# Patient Record
Sex: Male | Born: 1985 | Race: Black or African American | Hispanic: No | Marital: Single | Smoking: Current some day smoker
Health system: Southern US, Community
[De-identification: ages and names within clinical notes are randomized; demographics above are authoritative.]

## PROBLEM LIST (undated history)

## (undated) DIAGNOSIS — J45909 Unspecified asthma, uncomplicated: Secondary | ICD-10-CM

---

## 2009-11-07 ENCOUNTER — Inpatient Hospital Stay (HOSPITAL_COMMUNITY): Admission: EM | Admit: 2009-11-07 | Discharge: 2009-11-13 | Payer: Self-pay

## 2010-04-14 ENCOUNTER — Ambulatory Visit
Admission: RE | Admit: 2010-04-14 | Discharge: 2010-04-14 | Payer: Self-pay | Source: Home / Self Care | Admitting: Orthopedic Surgery

## 2010-11-10 LAB — SEDIMENTATION RATE: Sed Rate: 9 mm/hr (ref 0–16)

## 2010-11-10 LAB — C-REACTIVE PROTEIN: CRP: 0.1 mg/dL — ABNORMAL LOW (ref <0.6)

## 2010-11-10 LAB — POCT HEMOGLOBIN-HEMACUE: Hemoglobin: 15.9 g/dL (ref 13.0–17.0)

## 2010-11-20 LAB — CBC
HCT: 35.5 % — ABNORMAL LOW (ref 39.0–52.0)
HCT: 38.7 % — ABNORMAL LOW (ref 39.0–52.0)
Hemoglobin: 12.3 g/dL — ABNORMAL LOW (ref 13.0–17.0)
Hemoglobin: 13.1 g/dL (ref 13.0–17.0)
MCHC: 33.8 g/dL (ref 30.0–36.0)
MCHC: 34.6 g/dL (ref 30.0–36.0)
MCHC: 35 g/dL (ref 30.0–36.0)
MCV: 94.5 fL (ref 78.0–100.0)
MCV: 94.8 fL (ref 78.0–100.0)
MCV: 95.1 fL (ref 78.0–100.0)
Platelets: 137 10*3/uL — ABNORMAL LOW (ref 150–400)
Platelets: 174 10*3/uL (ref 150–400)
RBC: 3.73 MIL/uL — ABNORMAL LOW (ref 4.22–5.81)
RDW: 14.4 % (ref 11.5–15.5)
RDW: 14.7 % (ref 11.5–15.5)
WBC: 10.6 10*3/uL — ABNORMAL HIGH (ref 4.0–10.5)
WBC: 16.4 10*3/uL — ABNORMAL HIGH (ref 4.0–10.5)

## 2010-11-20 LAB — BASIC METABOLIC PANEL
BUN: 16 mg/dL (ref 6–23)
BUN: 6 mg/dL (ref 6–23)
BUN: 7 mg/dL (ref 6–23)
CO2: 30 mEq/L (ref 19–32)
CO2: 32 mEq/L (ref 19–32)
Calcium: 8.5 mg/dL (ref 8.4–10.5)
Calcium: 8.7 mg/dL (ref 8.4–10.5)
Calcium: 9.2 mg/dL (ref 8.4–10.5)
Chloride: 101 mEq/L (ref 96–112)
Chloride: 102 mEq/L (ref 96–112)
Creatinine, Ser: 1.15 mg/dL (ref 0.4–1.5)
Creatinine, Ser: 1.63 mg/dL — ABNORMAL HIGH (ref 0.4–1.5)
GFR calc Af Amer: >60 mL/min (ref >60)
GFR calc Af Amer: >60 mL/min (ref >60)
GFR calc non Af Amer: >60 mL/min (ref >60)
Glucose, Bld: 94 mg/dL (ref 70–99)
Potassium: 3 mEq/L — ABNORMAL LOW (ref 3.5–5.1)
Potassium: 4 mEq/L (ref 3.5–5.1)

## 2010-11-20 LAB — ABO/RH: ABO/RH(D): A POS

## 2010-11-20 LAB — TYPE AND SCREEN
ABO/RH(D): A POS
Antibody Screen: NEGATIVE
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
## 2012-11-06 ENCOUNTER — Emergency Department (HOSPITAL_COMMUNITY)
Admission: EM | Admit: 2012-11-06 | Discharge: 2012-11-06 | Disposition: A | Discharge status: Home / Self Care | Payer: No Typology Code available for payment source | Attending: Emergency Medicine | Admitting: Emergency Medicine

## 2012-11-06 ENCOUNTER — Encounter (HOSPITAL_COMMUNITY): Payer: Self-pay | Admitting: *Deleted

## 2012-11-06 ENCOUNTER — Emergency Department (HOSPITAL_COMMUNITY): Payer: No Typology Code available for payment source

## 2012-11-06 DIAGNOSIS — S139XXA Sprain of joints and ligaments of unspecified parts of neck, initial encounter: Secondary | ICD-10-CM | POA: Insufficient documentation

## 2012-11-06 DIAGNOSIS — Y9389 Activity, other specified: Secondary | ICD-10-CM | POA: Insufficient documentation

## 2012-11-06 DIAGNOSIS — S8990XA Unspecified injury of unspecified lower leg, initial encounter: Secondary | ICD-10-CM | POA: Insufficient documentation

## 2012-11-06 DIAGNOSIS — IMO0002 Reserved for concepts with insufficient information to code with codable children: Secondary | ICD-10-CM | POA: Insufficient documentation

## 2012-11-06 DIAGNOSIS — Y92410 Unspecified street and highway as the place of occurrence of the external cause: Secondary | ICD-10-CM | POA: Insufficient documentation

## 2012-11-06 MED ORDER — HYDROCODONE-ACETAMINOPHEN 5-325 MG PO TABS: 1.0000 | ORAL_TABLET | Freq: Four times a day (QID) | ORAL | Status: DC | PRN

## 2012-11-06 MED ORDER — NAPROXEN 500 MG PO TABS: 500.0000 mg | ORAL_TABLET | Freq: Two times a day (BID) | ORAL | Status: DC

## 2012-11-06 MED ORDER — OXYCODONE-ACETAMINOPHEN 5-325 MG PO TABS
2.0000 | ORAL_TABLET | Freq: Once | ORAL | Status: AC
  Administered 2012-11-06: 2 via ORAL
  Filled 2012-11-06: qty 2

## 2012-11-06 MED ORDER — METHOCARBAMOL 500 MG PO TABS: 500.0000 mg | ORAL_TABLET | Freq: Two times a day (BID) | ORAL | Status: DC
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
## 2012-11-11 NOTE — ED Provider Notes (Signed)
History/physical exam/procedure(s) were performed by non-physician practitioner and as supervising physician I was immediately available for consultation/collaboration. I have reviewed all notes and am in agreement with care and plan.   Hilario Quarry, MD 11/11/12 1044

## 2014-11-30 ENCOUNTER — Emergency Department (HOSPITAL_COMMUNITY)
Admission: EM | Admit: 2014-11-30 | Discharge: 2014-11-30 | Disposition: A | Discharge status: Home / Self Care | Payer: Self-pay | Attending: Emergency Medicine | Admitting: Emergency Medicine

## 2014-11-30 ENCOUNTER — Emergency Department (HOSPITAL_COMMUNITY): Payer: Self-pay

## 2014-11-30 ENCOUNTER — Encounter (HOSPITAL_COMMUNITY): Payer: Self-pay

## 2014-11-30 DIAGNOSIS — Y9367 Activity, basketball: Secondary | ICD-10-CM | POA: Insufficient documentation

## 2014-11-30 DIAGNOSIS — Z791 Long term (current) use of non-steroidal anti-inflammatories (NSAID): Secondary | ICD-10-CM | POA: Insufficient documentation

## 2014-11-30 DIAGNOSIS — R52 Pain, unspecified: Secondary | ICD-10-CM

## 2014-11-30 DIAGNOSIS — M20022 Boutonniere deformity of left finger(s): Secondary | ICD-10-CM | POA: Insufficient documentation

## 2014-11-30 DIAGNOSIS — Z720 Tobacco use: Secondary | ICD-10-CM | POA: Insufficient documentation

## 2014-11-30 DIAGNOSIS — Y92310 Basketball court as the place of occurrence of the external cause: Secondary | ICD-10-CM | POA: Insufficient documentation

## 2014-11-30 DIAGNOSIS — S62617A Displaced fracture of proximal phalanx of left little finger, initial encounter for closed fracture: Secondary | ICD-10-CM | POA: Insufficient documentation

## 2014-11-30 DIAGNOSIS — J45909 Unspecified asthma, uncomplicated: Secondary | ICD-10-CM | POA: Insufficient documentation

## 2014-11-30 DIAGNOSIS — Y998 Other external cause status: Secondary | ICD-10-CM | POA: Insufficient documentation

## 2014-11-30 DIAGNOSIS — W2105XA Struck by basketball, initial encounter: Secondary | ICD-10-CM | POA: Insufficient documentation

## 2014-11-30 DIAGNOSIS — Z79899 Other long term (current) drug therapy: Secondary | ICD-10-CM | POA: Insufficient documentation

## 2014-11-30 HISTORY — DX: Unspecified asthma, uncomplicated: J45.909

## 2014-11-30 MED ORDER — HYDROCODONE-ACETAMINOPHEN 5-325 MG PO TABS: ORAL_TABLET | ORAL | Status: DC

## 2014-11-30 NOTE — ED Notes (Signed)
Pt stable, ambulatory, pain 8/10, states understanding of discharge instructions

## 2014-11-30 NOTE — ED Notes (Signed)
Pt reports he was playing basketball 2 weeks ago and thinks he broke or dislocated his left pinky finger. He pulled it and bent it and "felt it pop." pt reports pain is getting worse and so he decided to have it assessed today. Finger appears swollen and able to move finger without difficulty.
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
## 2014-11-30 NOTE — Discharge Instructions (Signed)
You have a broken bone and a deformity is her tendon is not on the right place. He need to see the hand surgeon for definitive correction of this. Do not hesitate to return to the ED for any new, worsening or concerning symptoms.

## 2014-12-07 ENCOUNTER — Emergency Department (HOSPITAL_COMMUNITY)
Admission: EM | Admit: 2014-12-07 | Discharge: 2014-12-07 | Disposition: A | Discharge status: Home / Self Care | Payer: Self-pay | Attending: Emergency Medicine | Admitting: Emergency Medicine

## 2014-12-07 ENCOUNTER — Encounter (HOSPITAL_COMMUNITY): Payer: Self-pay | Admitting: *Deleted

## 2014-12-07 DIAGNOSIS — J45909 Unspecified asthma, uncomplicated: Secondary | ICD-10-CM | POA: Insufficient documentation

## 2014-12-07 DIAGNOSIS — S6992XD Unspecified injury of left wrist, hand and finger(s), subsequent encounter: Secondary | ICD-10-CM | POA: Insufficient documentation

## 2014-12-07 DIAGNOSIS — X58XXXD Exposure to other specified factors, subsequent encounter: Secondary | ICD-10-CM | POA: Insufficient documentation

## 2014-12-07 DIAGNOSIS — Z720 Tobacco use: Secondary | ICD-10-CM | POA: Insufficient documentation

## 2014-12-07 MED ORDER — TRAMADOL HCL 50 MG PO TABS: 50.0000 mg | ORAL_TABLET | Freq: Four times a day (QID) | ORAL | Status: DC | PRN

## 2014-12-07 NOTE — ED Provider Notes (Signed)
CSN: [id number]     Arrival date & time 12/07/14  1629 History  This chart was scribed for non-physician practitioner, Sharilyn SitesLisa Sanders, PA-C working with Nelva Nayobert Beaton, MD by Greggory StallionKayla Andersen, ED scribe. This patient was seen in room TR03C/TR03C and the patient's care was started at 5:04 PM.   Chief Complaint  Patient presents with  . Finger Injury   The history is provided by the patient. No language interpreter was used.    HPI Comments: Gabriel Ross is a 29 y.o. male who presents to the Emergency Department complaining of left fifth finger injury that occurred about 3 weeks ago. He originally dislocated it but reports continued pain and swelling in the finger. Pt has been evaluated in the ED and was told to follow up with Dr. Amanda PeaGramig. 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 Carlos LeveringGramig's office told him to come to the ED to be evaluated today. Movements worsen pain. He has worn the finger splint as directed but thinks it made the finger worse. Pt denies numbness. He is right hand dominant.   Past Medical History  Diagnosis Date  . Asthma    History reviewed. No pertinent past surgical history. No family history on file. History  Substance Use Topics  . Smoking status: Current Some Day Smoker  . Smokeless tobacco: Not on file  . Alcohol Use: Yes     Comment: socially    Review of Systems  Musculoskeletal: Positive for joint swelling and arthralgias.  Neurological: Negative for numbness.  All other systems reviewed and are negative.  Allergies  Review of patient's allergies indicates no known allergies.  Home Medications   Prior to Admission medications   Medication Sig Start Date End Date Taking? Authorizing Provider  acetaminophen (TYLENOL) 500 MG tablet Take 500 mg by mouth every 6 (six) hours as needed for mild pain.    Historical Provider, MD  HYDROcodone-acetaminophen (NORCO/VICODIN) 5-325 MG per tablet Take 1-2 tablets by mouth every 6 hours as needed for pain  and/or cough. 11/30/14   Nicole Pisciotta, PA-C  methocarbamol (ROBAXIN) 500 MG tablet Take 1 tablet (500 mg total) by mouth 2 (two) times daily. Patient not taking: Reported on 11/30/2014 11/06/12   Roxy Horsemanobert Browning, PA-C  naproxen (NAPROSYN) 500 MG tablet Take 1 tablet (500 mg total) by mouth 2 (two) times daily with a meal. Patient not taking: Reported on 11/30/2014 11/06/12   Roxy Horsemanobert Browning, PA-C   BP 122/76 mmHg  Pulse 97  Temp(Src) 98.7 F (37.1 C) (Oral)  Resp 18  Ht 6' 2.5" (1.892 m)  Wt 176 lb (79.833 kg)  BMI 22.30 kg/m2  SpO2 96%   Physical Exam  Constitutional: He is oriented to person, place, and time. He appears well-developed and well-nourished.  HENT:  Head: Normocephalic and atraumatic.  Mouth/Throat: Oropharynx is clear and moist.  Eyes: Conjunctivae and EOM are normal. Pupils are equal, round, and reactive to light.  Neck: Normal range of motion.  Cardiovascular: Normal rate, regular rhythm and normal heart sounds.   Pulmonary/Chest: Effort normal and breath sounds normal.  Abdominal: Soft. Bowel sounds are normal.  Musculoskeletal: Normal range of motion.  Left fifth digit PIP joint swollen and fully extended, unable to flex. Limited flexion of DIP joint with full ROM of MCP joint; normal cap refill; normal sensation  Neurological: He is alert and oriented to person, place, and time.  Skin: Skin is warm and dry.  Psychiatric: He has a normal mood and affect.  Nursing note and vitals reviewed.   ED Course  Procedures (including critical care time)  DIAGNOSTIC STUDIES: Oxygen Saturation is 96% on RA, normal by my interpretation.    COORDINATION OF CARE: 5:05 PM-Dr. Amanda Pea will see and evaluate pt. He is agreeable to plan.   5:55 PM-Dr. Carlos Levering PA-C, Karie Chimera, saw and evaluated pt.  Patient has elected against operative repair at this time.  Will d/c home with pain meds, splint, office FU in 1.5 weeks.  Labs Review Labs Reviewed - No data to  display  Imaging Review No results found.   EKG Interpretation None      MDM   Final diagnoses:  Finger injury, left, subsequent encounter   29 year old male with left fifth finger injury occurred approximately 3 weeks ago. He was seen in the ED and told to follow-up with hand surgery, however cannot afford co-pay. He has been wearing the splint as directed. On exam patient has diffuse swelling of his left fifth PIP joint as unable to flex finger at this joint level. 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 Finger was resplinted in the ED. Patient will be discharged home with pain medication and follow-up with Dr. Amanda Pea in office in one and half weeks.  Discussed plan with patient, he/she acknowledged understanding and agreed with plan of care.  Return precautions given for new or worsening symptoms.  I personally performed the services described in this documentation, which was scribed in my presence. The recorded information has been reviewed and is accurate.  Garlon Hatchet, PA-C 12/07/14 1846  Nelva Nay, MD 12/10/14 [phone]

## 2014-12-07 NOTE — ED Notes (Signed)
Pt states he has a dislocated pinky finger on his left hand, happened 2-3 weeks ago. States that he was supposed to see Dr Amanda PeaGramig but could not afford the self pay. His office told pt to come to ED to see Dr Amanda PeaGramig.

## 2014-12-07 NOTE — Consult Note (Signed)
Reason for Consult: Left small finger fracture Referring Physician: Dr. Wilhemena Durie is an 29 y.o. male.  HPI: The patient is a pleasant 29 year old male who presents to the emergency room setting for evaluation of his left small finger. He sustained an injury over 2-1/2 weeks ago, on Easter weekend while playing basketball. He states that he sustained a jamming injury to the small finger PIP joint at that juncture. He describes a dislocating type event and states that he self reduced this. He was seen and evaluated emergency room where he is placed into a splint and his continued pain and difficulties he returns tonight reevaluation. Hand surgery consultation was implemented. Patient states that he still has tenderness about the finger with loss of motion present. He denies numbness or tingling. He denies injuries to the other digits. We have previously reviewed his radiographs. He has a actual about the proximal phalanx distally at the PIP joint, this is intra-articular and displaced, and unfortunately subacute in nature. We have discussed with them this is a nascent malunion. Spent over 30 minutes in duration discussing all issues with Mr. Ponzo.  Past Medical History  Diagnosis Date  . Asthma     History reviewed. No pertinent past surgical history.  No family history on file.  Social History:  reports that he has been smoking.  He does not have any smokeless tobacco history on file. He reports that he drinks alcohol. He reports that he uses illicit drugs (Marijuana).  Allergies: No Known Allergies  Medications: None  No results found for this or any previous visit (from the past 48 hour(s)).  No results found.  Review of Systems  Constitutional: Negative.   HENT: Negative.   Eyes: Negative.   Respiratory: Negative.   Cardiovascular: Negative.   Gastrointestinal: Negative.   Genitourinary: Negative.   Musculoskeletal:       See history of present illness  Skin:  Negative.   Neurological: Negative.   Endo/Heme/Allergies: Negative.    Blood pressure 122/76, pulse 97, temperature 98.7 F (37.1 C), temperature source Oral, resp. rate 18, height 6' 2.5" (1.892 m), weight 79.833 kg (176 lb), SpO2 96 %. Physical Exam Physical examination of the left small finger shows that he holds finger at flexion of approximately 25-30 he has a very short arc of motion which is mildly tender. FDS and FDP appear intact. His sensation refill are intact. He has mild to moderate swelling about the PIP joint. No signs of infection or dystrophy are present. .The patient is alert and oriented in no acute distress. The patient complains of pain in the affected upper extremity.  The patient is noted to have a normal HEENT exam. Lung fields show equal chest expansion and no shortness of breath. Abdomen exam is nontender without distention. Lower extremity examination does not show any fracture dislocation or blood clot symptoms. Pelvis is stable and the neck and back are stable and nontender. 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 We have discussed with him attempts at takedown of the nascent malunion and open reduction internal fixation to try to give him functional range of motion, prevent traumatic arthritis, and event some degree of chronic pain. We Discussed with him realistic expectations as this is subacute in nature. After thorough thoughtful discussion the patient wants to avoid proceeding with surgical intervention and simply allow this to heal in a  mal- union type fashion. He is self-employed and states he simply is unable to take time out of work to heal after a surgery. He understands that he will most likely go on to develop traumatic arthritis, pain and further dysfunction. Should this occur we have discussed with him a fusion  about the PIP joint would be a salvage type procedure. He states good understanding of his current predicament and treatment options, once again he states he would like proceed with conservative route accepting the inherent risks as discussed. We have placed him in a PIP splint that he will wear at all times except showers and baths. We will need to see him in our office setting in 1-1/2 weeks for recheck. He will be given Ultram for pain. Questions were encouraged and answered.  Pavlos Yon L 12/07/2014, 6:20 PM

## 2014-12-07 NOTE — Discharge Instructions (Signed)
Take the prescribed medication as directed. Follow-up with Dr. Amanda PeaGramig in a week and half-- call to make appt. Return to the ED for new or worsening symptoms.

## 2015-11-14 ENCOUNTER — Encounter (HOSPITAL_COMMUNITY): Payer: Self-pay

## 2015-11-14 ENCOUNTER — Emergency Department (HOSPITAL_COMMUNITY)
Admission: EM | Admit: 2015-11-14 | Discharge: 2015-11-14 | Disposition: A | Discharge status: Home / Self Care | Payer: MEDICAID | Attending: Emergency Medicine | Admitting: Emergency Medicine

## 2015-11-14 DIAGNOSIS — F17200 Nicotine dependence, unspecified, uncomplicated: Secondary | ICD-10-CM | POA: Insufficient documentation

## 2015-11-14 DIAGNOSIS — K0889 Other specified disorders of teeth and supporting structures: Secondary | ICD-10-CM | POA: Insufficient documentation

## 2015-11-14 DIAGNOSIS — J45909 Unspecified asthma, uncomplicated: Secondary | ICD-10-CM | POA: Insufficient documentation

## 2015-11-14 MED ORDER — HYDROCODONE-ACETAMINOPHEN 5-325 MG PO TABS: 1.0000 | ORAL_TABLET | ORAL | Status: AC | PRN

## 2015-11-14 MED ORDER — PENICILLIN V POTASSIUM 500 MG PO TABS
500.0000 mg | ORAL_TABLET | Freq: Four times a day (QID) | ORAL | Status: AC
Start: 2015-11-14 — End: ?

## 2015-11-14 NOTE — ED Notes (Signed)
Pt presents with c/o right lower dental pain for the past 4 days.

## 2015-11-14 NOTE — Discharge Instructions (Signed)
1. Medications: penicillin, vicodin, usual home medications 2. Treatment: rest, drink plenty of fluids 3. Follow Up: please followup with your dentist for discussion of your diagnoses and further evaluation after today's visit; if you do not have a primary care doctor use the phone number listed in your discharge paperwork to find one; please return to the ER for high fever, difficulty swallowing, new or worsening symptoms  [employer] 211 is a great source of information about community services available.  Access by dialing 2-1-1 from anywhere in [place], or by website -  PooledIncome.plwww.nc211.org.   Other Local Resources (Updated 08/2015)  Dental  Care   Services    Phone Number and Address  Cost  [facility] For children 590 - 31 years of age:   Cleaning  Tooth brushing/flossing instruction  Sealants, fillings, crowns  Extractions  Emergency treatment  [phone] [address] [place], [place] [address] Charges based on family income.  Medicaid and some insurance plans accepted.     [facility]  Cleaning  Sealants, fillings, crowns  Extractions  Emergency treatment [phone] [address] [place], [place]  Pregnant women 31 years of age or older with a Medicaid card  [facility] [place]  Cleaning  Sealants, fillings, crowns  Extractions  Emergency treatment [phone] [address] [place], [place] Pregnant women 31 years of age or older with a Medicaid card  [facility] For children 550 - 31 years of age:   Cleaning  Tooth brushing/flossing instruction  Sealants, fillings, crowns  Extractions  Emergency treatment Limited orthodontic services for patients with Medicaid [phone] [address] [place], [place] [address] Medicaid and [facility]  Health Choice cover for children up to age 31 and pregnant women.  Parents of children up to age 31 without Medicaid pay a reduced fee at time of service.  [facility] [place] For children 670 - 31 years of age:   Cleaning  Tooth brushing/flossing instruction  Sealants, fillings, crowns  Extractions  Emergency treatment Limited orthodontic services for patients with Medicaid [phone] [address] [place], [place].  Medicaid and [place] Health Choice cover for children up to age 31 and pregnant women.  Parents of children up to age 31 without Medicaid pay a reduced fee.  [facility]  Cleaning  Sealants, fillings, crowns  Extractions  Hours: Tuesdays and Thursdays, 4:15 - 8 pm [phone] [address], [address] [place], [place] [address] Services free of charge to [facility] residents ages 18-64 who do not have health insurance, Medicare, [place], or [place] benefits and fall within federal poverty guidelines  [facility]    Provides dental care in addition to primary medical care, nutritional counseling, and pharmacy:  Nurse, mental healthCleaning  Sealants, fillings, crowns  Extractions                  [phone] [facility], [address] [place], [place]  [phone] [facility], [address] [place], [place]  [phone] [facility] [place], [place]  [phone] [facility], [address] [facility] [place], [place]  [phone] [facility] [address] [place], [place] Accepts [place], [place]icare, most insurance.  Also provides services available to all with fees adjusted based  on ability to pay.    [facility]  Cleaning  Tooth brushing/flossing instruction  Sealants, fillings, crowns  Extractions  Emergency  treatment Hours: Tuesdays, Thursdays, and Fridays from 8 am to 5 pm by appointment only. [phone] [address] [place], [place] [address] [facility] residents with Medicaid (depending on eligibility) and children with [facility] Health Choice - call for more information.  [facility]  Extractions only  Hours: 2nd and 4th Thursday of each month from 6:30 am - 9 am.   [phone] [address] [place], [place] [address] Ages 59 and older only.  Patients are seen on a first come, first served basis.  [employer]  [employer]  Extractions  Orthodontics  Endodontics  Implants/Crowns/Bridges  Complete and partial dentures [phone] [place], [place] Patients must complete an application for services.  There is often a waiting list.

## 2015-11-14 NOTE — ED Provider Notes (Signed)
CSN: [id number]     Arrival date & time 11/14/15  1011 History   First MD Initiated Contact with Patient 11/14/15 1023     Chief Complaint  Patient presents with  . Dental Pain    HPI   Gabriel Ross is a 30 y.o. male with a PMH of asthma who presents to the ED with right lower dental pain x 2 weeks. He notes his symptoms have worsened over the past 4 days. He states his pain is constant and makes him feel like he could cry. He notes drinking cold fluids and eating exacerbate his pain. He has tried extra strength tylenol and orajel at home with no significant symptom relief. He denies fever, chills, difficulty swallowing or handling his secretions.   Past Medical History  Diagnosis Date  . Asthma    History reviewed. No pertinent past surgical history. No family history on file. Social History  Substance Use Topics  . Smoking status: Current Some Day Smoker  . Smokeless tobacco: None  . Alcohol Use: Yes     Comment: socially      Review of Systems  Constitutional: Negative for fever and chills.  HENT: Positive for dental problem. Negative for drooling and trouble swallowing.       Allergies  Lactose intolerance (gi)  Home Medications   Prior to Admission medications   Medication Sig Start Date End Date Taking? Authorizing Provider  acetaminophen (TYLENOL) 500 MG tablet Take 500 mg by mouth every 6 (six) hours as needed for mild pain.   Yes Historical Provider, MD  benzocaine (ORAJEL) 10 % mucosal gel Use as directed 1 application in the mouth or throat 3 (three) times daily as needed for mouth pain.   Yes Historical Provider, MD  HYDROcodone-acetaminophen (NORCO/VICODIN) 5-325 MG tablet Take 1-2 tablets by mouth every 4 (four) hours as needed. 11/14/15   Mady GemmaElizabeth C Arah Aro, PA-C  penicillin v potassium (VEETID) 500 MG tablet Take 1 tablet (500 mg total) by mouth 4 (four) times daily. 11/14/15   Kaijah Abts C Tonie Elsey, PA-C    BP 108/70 mmHg  Pulse 68  Temp(Src) 98.5 F  (36.9 C) (Oral)  Resp 20  SpO2 96% Physical Exam  Constitutional: He is oriented to person, place, and time. He appears well-developed and well-nourished. No distress.  HENT:  Head: Normocephalic and atraumatic.  Right Ear: External ear normal.  Left Ear: External ear normal.  Nose: Nose normal.  Mouth/Throat: Uvula is midline, oropharynx is clear and moist and mucous membranes are normal. No oropharyngeal exudate, posterior oropharyngeal edema, posterior oropharyngeal erythema or tonsillar abscesses.    Mild erythema and TTP to gumline of right lower 3rd molar. No obvious abscess. No swelling to floor of mouth.  Eyes: Conjunctivae and EOM are normal. Right eye exhibits no discharge. Left eye exhibits no discharge. No scleral icterus.  Neck: Normal range of motion. Neck supple.  Cardiovascular: Normal rate and regular rhythm.   Pulmonary/Chest: Effort normal and breath sounds normal. No respiratory distress.  Musculoskeletal: Normal range of motion. He exhibits no edema or tenderness.  Neurological: He is alert and oriented to person, place, and time.  Skin: Skin is warm and dry. He is not diaphoretic.  Psychiatric: He has a normal mood and affect. His behavior is normal.  Nursing note and vitals reviewed.   ED Course  Procedures (including critical care time)  Labs Review Labs Reviewed - No data to display  Imaging Review No results found.    EKG Interpretation None  MDM   Final diagnoses:  Pain, dental    30 year old male presents with right lower dental pain x 2 weeks, worse x 4 days. Patient is afebrile. Vital signs stable. On exam, patient has mild erythema and TTP to gumline of right lower 3rd molar. No obvious abscess. No swelling to floor of mouth to suggest ludwig's angina. Patient is non-toxic and well-appearing, feel he is stable for discharge. Will give antibiotic and short course of pain medication. Patient to follow-up with dentist, resource list  given. Return precautions discussed. Patient verbalizes his understanding and is in agreement with plan.  BP 108/70 mmHg  Pulse 68  Temp(Src) 98.5 F (36.9 C) (Oral)  Resp 20  SpO2 96%      Mady Gemma, PA-C 11/14/15 1057  Azalia Bilis, MD 11/14/15 1520

## 2016-12-11 ENCOUNTER — Encounter (HOSPITAL_COMMUNITY): Payer: Self-pay | Admitting: *Deleted

## 2016-12-11 ENCOUNTER — Emergency Department (HOSPITAL_COMMUNITY)
Admission: EM | Admit: 2016-12-11 | Discharge: 2016-12-11 | Disposition: A | Discharge status: Home / Self Care | Payer: No Typology Code available for payment source | Attending: Emergency Medicine | Admitting: Emergency Medicine

## 2016-12-11 DIAGNOSIS — J45909 Unspecified asthma, uncomplicated: Secondary | ICD-10-CM | POA: Insufficient documentation

## 2016-12-11 DIAGNOSIS — Y999 Unspecified external cause status: Secondary | ICD-10-CM | POA: Diagnosis not present

## 2016-12-11 DIAGNOSIS — Y92410 Unspecified street and highway as the place of occurrence of the external cause: Secondary | ICD-10-CM | POA: Insufficient documentation

## 2016-12-11 DIAGNOSIS — S299XXA Unspecified injury of thorax, initial encounter: Secondary | ICD-10-CM | POA: Diagnosis not present

## 2016-12-11 DIAGNOSIS — Z79899 Other long term (current) drug therapy: Secondary | ICD-10-CM | POA: Diagnosis not present

## 2016-12-11 DIAGNOSIS — Y939 Activity, unspecified: Secondary | ICD-10-CM | POA: Diagnosis not present

## 2016-12-11 DIAGNOSIS — F17200 Nicotine dependence, unspecified, uncomplicated: Secondary | ICD-10-CM | POA: Diagnosis not present

## 2016-12-11 DIAGNOSIS — S199XXA Unspecified injury of neck, initial encounter: Secondary | ICD-10-CM | POA: Diagnosis present

## 2016-12-11 DIAGNOSIS — R51 Headache: Secondary | ICD-10-CM | POA: Diagnosis not present

## 2016-12-11 DIAGNOSIS — R519 Headache, unspecified: Secondary | ICD-10-CM

## 2016-12-11 DIAGNOSIS — M7918 Myalgia, other site: Secondary | ICD-10-CM

## 2016-12-11 MED ORDER — KETOROLAC TROMETHAMINE 30 MG/ML IJ SOLN
60.0000 mg | Freq: Once | INTRAMUSCULAR | Status: AC
  Administered 2016-12-11: 60 mg via INTRAMUSCULAR
  Filled 2016-12-11: qty 2

## 2016-12-11 MED ORDER — IBUPROFEN 800 MG PO TABS: 800.0000 mg | ORAL_TABLET | Freq: Four times a day (QID) | ORAL | 0 refills | Status: AC | PRN
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
## 2016-12-11 NOTE — ED Notes (Signed)
Got patient undressed on the monitor waiting for provider

## 2016-12-11 NOTE — ED Notes (Signed)
Pt is in stable condition upon d/c and ambulates from ED. 

## 2021-10-19 ENCOUNTER — Encounter: Payer: Self-pay | Admitting: Emergency Medicine

## 2021-10-19 ENCOUNTER — Ambulatory Visit (INDEPENDENT_AMBULATORY_CARE_PROVIDER_SITE_OTHER): Payer: Self-pay

## 2021-10-19 ENCOUNTER — Ambulatory Visit
Admission: EM | Admit: 2021-10-19 | Discharge: 2021-10-19 | Disposition: A | Discharge status: Home / Self Care | Payer: Self-pay | Attending: Physician Assistant | Admitting: Physician Assistant

## 2021-10-19 DIAGNOSIS — S62327A Displaced fracture of shaft of fifth metacarpal bone, left hand, initial encounter for closed fracture: Secondary | ICD-10-CM

## 2021-10-19 DIAGNOSIS — S6992XA Unspecified injury of left wrist, hand and finger(s), initial encounter: Secondary | ICD-10-CM

## 2021-10-19 DIAGNOSIS — S62317A Displaced fracture of base of fifth metacarpal bone. left hand, initial encounter for closed fracture: Secondary | ICD-10-CM

## 2021-10-19 NOTE — ED Triage Notes (Signed)
Hit left hand on a pole playing basketball yesterday. Left lateral hand swollen, tender, limited ROM

## 2021-10-19 NOTE — ED Provider Notes (Signed)
[place]    CSN: [id number] Arrival date & time: 10/19/21  1954      History   Chief Complaint Chief Complaint  Patient presents with   Hand Injury    HPI Gabriel Ross is a 36 y.o. male.   Patient here today for evaluation of left hand injury that occurred yesterday.  He states he accidentally hit his hand on a pole while playing basketball.  He notes that he has continued to have pain around the fifth metacarpal area as well as swelling.  He has limited range of motion of fifth digit due to pain.  He has had injury both to fifth digit as well as left forearm in the past.  He denies any numbness or tingling.  He has tried taking Tylenol without significant relief.  The history is provided by the patient.  Hand Injury Associated symptoms: no fever    Past Medical History:  Diagnosis Date   Asthma     There are no problems to display for this patient.   History reviewed. No pertinent surgical history.     Home Medications    Prior to Admission medications   Medication Sig Start Date End Date Taking? Authorizing Provider  acetaminophen (TYLENOL) 500 MG tablet Take 500 mg by mouth every 6 (six) hours as needed for mild pain.    [provider]  benzocaine (ORAJEL) 10 % mucosal gel Use as directed 1 application in the mouth or throat 3 (three) times daily as needed for mouth pain.    [provider]  HYDROcodone-acetaminophen (NORCO/VICODIN) 5-325 MG tablet Take 1-2 tablets by mouth every 4 (four) hours as needed. Patient not taking: Reported on 12/11/2016 11/14/15   Marella Chimes, PA-C  ibuprofen (ADVIL,MOTRIN) 800 MG tablet Take 1 tablet (800 mg total) by mouth every 6 (six) hours as needed. Please take with food to lessen stomach upset. 12/11/16   Lorin Glass, PA-C  penicillin v potassium (VEETID) 500 MG tablet Take 1 tablet (500 mg total) by mouth 4 (four) times daily. Patient not taking: Reported on 12/11/2016 11/14/15    Eustace Quail    Family History History reviewed. No pertinent family history.  Social History Social History   Tobacco Use   Smoking status: Some Days   Smokeless tobacco: Never  Substance Use Topics   Alcohol use: Yes    Comment: socially   Drug use: Yes    Types: Marijuana     Allergies   Lactose intolerance (gi)   Review of Systems Review of Systems  Constitutional:  Negative for chills and fever.  Eyes:  Negative for discharge and redness.  Musculoskeletal:  Positive for arthralgias.  Skin:  Negative for color change and wound.  Neurological:  Negative for numbness.    Physical Exam Triage Vital Signs ED Triage Vitals  Enc Vitals Group     BP 10/19/21 2002 125/82     Pulse Rate 10/19/21 2002 91     Resp 10/19/21 2002 16     Temp 10/19/21 2002 98.3 F (36.8 C)     Temp Source 10/19/21 2002 Oral     SpO2 10/19/21 2002 95 %     Weight --      Height --      Head Circumference --      Peak Flow --      Pain Score 10/19/21 2001 10     Pain Loc --      Pain Edu? --  Excl. in GC? --    No data found.  Updated Vital Signs BP 125/82 (BP Location: Left Arm)    Pulse 91    Temp 98.3 F (36.8 C) (Oral)    Resp 16    SpO2 95%      Physical Exam Vitals and nursing note reviewed.  Constitutional:      General: He is not in acute distress.    Appearance: Normal appearance. He is not ill-appearing.  HENT:     Head: Normocephalic and atraumatic.  Eyes:     Conjunctiva/sclera: Conjunctivae normal.  Cardiovascular:     Rate and Rhythm: Normal rate.  Pulmonary:     Effort: Pulmonary effort is normal.  Musculoskeletal:     Comments: Diffuse swelling noted to left hand around fifth metacarpal area, tenderness palpation of same.  Decreased range of motion of left fifth digit  Neurological:     Mental Status: He is alert.  Psychiatric:        Mood and Affect: Mood normal.        Behavior: Behavior normal.        Thought Content: Thought  content normal.     UC Treatments / Results  Labs (all labs ordered are listed, but only abnormal results are displayed) Labs Reviewed - No data to display  EKG   Radiology DG Hand Complete Left  Result Date: 10/19/2021 CLINICAL DATA:  Trauma, head hand on pole playing basketball yesterday. Swelling, tenderness and limited range of motion. EXAM: LEFT HAND - COMPLETE 3+ VIEW COMPARISON:  Remote radiograph 11/30/2014 FINDINGS: Acute mildly comminuted, displaced and angulated distal fifth metacarpal shaft fracture. No intra-articular involvement. No additional acute fracture of the hand. The fifth digit is held in flexion at the proximal interphalangeal joint on all views, chronic. The previous fifth digit proximal phalanx fracture is healed. Postsurgical change of the distal radius and ulna with metallic/ballistic debris, partially included. IMPRESSION: Acute mildly comminuted, displaced and angulated distal fifth metacarpal shaft fracture. No intra-articular involvement. Electronically Signed   By: Keith Rake M.D.   On: 10/19/2021 20:17    Procedures Procedures (including critical care time)  Medications Ordered in UC Medications - No data to display  Initial Impression / Assessment and Plan / UC Course  I have reviewed the triage vital signs and the nursing notes.  Pertinent labs & imaging results that were available during my care of the patient were reviewed by me and considered in my medical decision making (see chart for details).    X-ray with comminuted fracture of the left fifth metacarpal.  Discussed options for splinting in office and follow-up with Ortho the next morning versus emergency room evaluation for resetting and casting.  Patient prefers to emergency room evaluation given lack of insurance. Recommended he report to ED after leaving our office as further delay in treating and setting may prolong healing time.   Final Clinical Impressions(s) / UC Diagnoses    Final diagnoses:  Hand injury, left, initial encounter  Closed displaced fracture of shaft of fifth metacarpal bone of left hand, initial encounter   Discharge Instructions   None    ED Prescriptions   None    PDMP not reviewed this encounter.   Francene Finders, PA-C 10/20/21 [phone]

## 2021-10-22 ENCOUNTER — Encounter (HOSPITAL_COMMUNITY): Payer: Self-pay

## 2021-10-22 ENCOUNTER — Emergency Department (HOSPITAL_COMMUNITY)
Admission: EM | Admit: 2021-10-22 | Discharge: 2021-10-22 | Disposition: A | Discharge status: Home / Self Care | Payer: Self-pay | Attending: Emergency Medicine | Admitting: Emergency Medicine

## 2021-10-22 ENCOUNTER — Other Ambulatory Visit: Payer: Self-pay

## 2021-10-22 ENCOUNTER — Emergency Department (HOSPITAL_COMMUNITY): Payer: Self-pay

## 2021-10-22 DIAGNOSIS — W228XXD Striking against or struck by other objects, subsequent encounter: Secondary | ICD-10-CM | POA: Insufficient documentation

## 2021-10-22 DIAGNOSIS — S62307D Unspecified fracture of fifth metacarpal bone, left hand, subsequent encounter for fracture with routine healing: Secondary | ICD-10-CM

## 2021-10-22 DIAGNOSIS — Y9367 Activity, basketball: Secondary | ICD-10-CM | POA: Insufficient documentation

## 2021-10-22 DIAGNOSIS — S62337D Displaced fracture of neck of fifth metacarpal bone, left hand, subsequent encounter for fracture with routine healing: Secondary | ICD-10-CM | POA: Insufficient documentation

## 2021-10-22 MED ORDER — OXYCODONE-ACETAMINOPHEN 5-325 MG PO TABS
1.0000 | ORAL_TABLET | Freq: Once | ORAL | Status: AC
  Administered 2021-10-22: 1 via ORAL
  Filled 2021-10-22: qty 1

## 2021-10-22 MED ORDER — LIDOCAINE HCL (PF) 1 % IJ SOLN
10.0000 mL | Freq: Once | INTRAMUSCULAR | Status: AC
Start: 2021-10-22 — End: 2021-10-22
  Administered 2021-10-22: 10 mL
  Filled 2021-10-22: qty 10

## 2021-10-22 NOTE — Progress Notes (Signed)
Orthopedic Tech Progress Note Patient Details:  Gabriel Ross 01/19/1986 [id number]  Ortho Devices Type of Ortho Device: Ulna gutter splint Ortho Device/Splint Location: lue Ortho Device/Splint Interventions: Ordered, Application, Adjustment   Post Interventions Patient Tolerated: Well  [name] 10/22/2021, 4:17 PM

## 2021-10-22 NOTE — Discharge Instructions (Signed)
It is very important that you follow-up with a hand specialist or orthopedic specialist.  Please contact their office tomorrow to request close follow-up appointment, ideally to be seen within the next couple days.  Continue your splint for now.  Recommend no weightbearing in your arm/hand until further instructed by Ortho.  Take Tylenol or Motrin for pain control.

## 2021-10-22 NOTE — ED Provider Notes (Signed)
[facility] EMERGENCY DEPARTMENT Provider Note   CSN: [id number] Arrival date & time: 10/22/21  1456     History  Chief Complaint  Patient presents with   Hand Injury    Gabriel Ross is a 36 y.o. male.  Presenting to the emergency room with concern for left hand injury.  Patient reports that injury originally occurred on 2/22, occurred while hitting his hand on a pole while playing basketball.  He went to urgent care subsequently including on the evening of 2/23.  Diagnosed with hand fracture and advised close follow-up with Ortho or ER.  Patient went to the orthopedic clinic on 2/24 but said that he could not afford the $250 office fee and did not see a provider at that visit.  He came to the ER today for further assistance.  Was not splinted in urgent care.  Has tried some Tylenol without significant relief.    HPI     Home Medications Prior to Admission medications   Medication Sig Start Date End Date Taking? Authorizing Provider  acetaminophen (TYLENOL) 500 MG tablet Take 500 mg by mouth every 6 (six) hours as needed for mild pain.    [provider]  benzocaine (ORAJEL) 10 % mucosal gel Use as directed 1 application in the mouth or throat 3 (three) times daily as needed for mouth pain.    [provider]  HYDROcodone-acetaminophen (NORCO/VICODIN) 5-325 MG tablet Take 1-2 tablets by mouth every 4 (four) hours as needed. Patient not taking: Reported on 12/11/2016 11/14/15   Marella Chimes, PA-C  ibuprofen (ADVIL,MOTRIN) 800 MG tablet Take 1 tablet (800 mg total) by mouth every 6 (six) hours as needed. Please take with food to lessen stomach upset. 12/11/16   Lorin Glass, PA-C  penicillin v potassium (VEETID) 500 MG tablet Take 1 tablet (500 mg total) by mouth 4 (four) times daily. Patient not taking: Reported on 12/11/2016 11/14/15   Marella Chimes, PA-C      Allergies    Lactose intolerance (gi)    Review of Systems    Review of Systems  Musculoskeletal:  Positive for arthralgias.  All other systems reviewed and are negative.  Physical Exam Updated Vital Signs BP 115/84    Pulse 77    Temp 98.2 F (36.8 C) (Oral)    Resp 14    Ht 6\' 3"  (1.905 m)    Wt 81.6 kg    SpO2 100%    BMI 22.50 kg/m  Physical Exam Vitals and nursing note reviewed.  Constitutional:      General: He is not in acute distress.    Appearance: He is well-developed.  HENT:     Head: Normocephalic and atraumatic.  Eyes:     Conjunctiva/sclera: Conjunctivae normal.  Cardiovascular:     Rate and Rhythm: Normal rate and regular rhythm.     Heart sounds: No murmur heard. Pulmonary:     Effort: Pulmonary effort is normal. No respiratory distress.  Abdominal:     Palpations: Abdomen is soft.  Musculoskeletal:     Cervical back: Neck supple.     Comments: Left hand: There is tenderness and some swelling to ulnar part of hand, tenderness over the distal shaft of fifth metacarpal  Skin:    General: Skin is warm and dry.     Capillary Refill: Capillary refill takes less than 2 seconds.  Neurological:     Mental Status: He is alert.  Psychiatric:  Mood and Affect: Mood normal.    ED Results / Procedures / Treatments   Labs (all labs ordered are listed, but only abnormal results are displayed) Labs Reviewed - No data to display  EKG None  Radiology DG Hand Complete Left  Result Date: 10/22/2021 CLINICAL DATA:  Left hand fracture EXAM: LEFT HAND - COMPLETE 3+ VIEW COMPARISON:  10/19/2021 FINDINGS: Redemonstrated mildly displaced and angulated fracture of the fifth metacarpal neck. No significant change in alignment compared to prior. No new fractures identified. Extensive postoperative and posttraumatic changes of the distal radius and ulna. Overlying splint material. IMPRESSION: Mildly displaced and angulated fracture of the fifth metacarpal neck without significant change in alignment. Electronically Signed   By:  Davina Poke D.O.   On: 10/22/2021 16:39    Procedures .Ortho Injury Treatment  Date/Time: 10/22/2021 4:19 PM Performed by: Lucrezia Starch, MD Authorized by: Lucrezia Starch, MD   Consent:    Consent obtained:  Verbal   Consent given by:  Patient   Risks discussed:  Fracture, irreducible dislocation, recurrent dislocation, nerve damage, restricted joint movement, stiffness and vascular damage   Alternatives discussed:  No treatment, immobilization, referral, delayed treatment and alternative treatmentInjury location: hand Location details: left hand Injury type: fracture-dislocation Pre-procedure neurovascular assessment: neurovascularly intact Pre-procedure distal perfusion: normal Pre-procedure neurological function: normal Pre-procedure range of motion: reduced Anesthesia: local infiltration  Anesthesia: Local anesthesia used: yes Local Anesthetic: lidocaine 1% without epinephrine Anesthetic total: 3 mL  Patient sedated: NoManipulation performed: yes Skeletal traction used: yes Reduction successful: no Immobilization: splint Splint type: ulnar gutter Splint Applied by: Sheliah Hatch Post-procedure neurovascular assessment: post-procedure neurovascularly intact Post-procedure distal perfusion: normal Post-procedure neurological function: normal Post-procedure range of motion: unchanged      Medications Ordered in ED Medications  oxyCODONE-acetaminophen (PERCOCET/ROXICET) 5-325 MG per tablet 1 tablet (1 tablet Oral Given 10/22/21 1600)  lidocaine (PF) (XYLOCAINE) 1 % injection 10 mL (10 mLs Infiltration Given by Other 10/22/21 1601)    ED Course/ Medical Decision Making/ A&P                           Medical Decision Making Amount and/or Complexity of Data Reviewed Radiology: ordered.  Risk Prescription drug management.   36 year old gentleman presenting to the emergency room with concern for left hand injury.  Injury originally occurred on 2/22, for  sought medical care urgent care on 2/23.  Then followed up with Ortho on 2/24 but did not see provider due to inability to pay.  Provided Percocet for pain control.  Dr. Lenon Curt listed as on-call in amnion but told secretary he is not on call and will not take any calls from ER today, advised calling Dr. Stann Mainland. Dr. Stann Mainland with [facility] said he was not technically on-call today but was happy to discuss the case.  He states that because the injury happened a few days ago, closed reduction in ER of the angulation of the fracture is highly unlikely to be successful and advised just splinting and sending to hand/ortho on an out pt basis for further management. Discussed with patient concern that for permanent deformity and some dysfunction/difficulty to extend the fifth digit if he does not receive definitive care from orthopedic/hand specialist.  He strongly requested reduction attempt in ER.  I discussed with patient my discussion with the orthopedic surgeon that I felt there was a low chance of success but given the overall picture, did offer to try.  Patient wished to proceed.  My manipulation attempt was unsuccessful.  Repeat x-ray postmanipulation is unchanged.  Will place in ulnar gutter splint.  I stressed the need to follow-up with Ortho or hand specialist and discussed long-term ramifications if this is not managed properly in a timely fashion. Provided information for Dr. Brennan Bailey office as well as Dr. Stann Mainland.         Final Clinical Impression(s) / ED Diagnoses Final diagnoses:  Closed displaced fracture of fifth metacarpal bone of left hand with routine healing, unspecified portion of metacarpal, subsequent encounter    Rx / DC Orders ED Discharge Orders     None         Lucrezia Starch, MD 10/22/21 2324
